# Patient Record
Sex: Male | Born: 1990 | Race: White | Hispanic: No | Marital: Married | Smoking: Former smoker
Health system: Southern US, Community
[De-identification: ages and names within clinical notes are randomized; demographics above are authoritative.]

## PROBLEM LIST (undated history)

## (undated) DIAGNOSIS — F4310 Post-traumatic stress disorder, unspecified: Secondary | ICD-10-CM

## (undated) DIAGNOSIS — J189 Pneumonia, unspecified organism: Secondary | ICD-10-CM

## (undated) DIAGNOSIS — G4730 Sleep apnea, unspecified: Secondary | ICD-10-CM

---

## 2013-02-26 ENCOUNTER — Encounter (HOSPITAL_COMMUNITY): Payer: Self-pay | Admitting: *Deleted

## 2013-02-26 ENCOUNTER — Emergency Department (HOSPITAL_COMMUNITY)
Admission: EM | Admit: 2013-02-26 | Discharge: 2013-02-26 | Disposition: A | Discharge status: Home / Self Care | Payer: No Typology Code available for payment source | Attending: Emergency Medicine | Admitting: Emergency Medicine

## 2013-02-26 ENCOUNTER — Emergency Department (HOSPITAL_COMMUNITY): Payer: No Typology Code available for payment source

## 2013-02-26 DIAGNOSIS — R059 Cough, unspecified: Secondary | ICD-10-CM | POA: Insufficient documentation

## 2013-02-26 DIAGNOSIS — R071 Chest pain on breathing: Secondary | ICD-10-CM | POA: Insufficient documentation

## 2013-02-26 DIAGNOSIS — Z880 Allergy status to penicillin: Secondary | ICD-10-CM | POA: Insufficient documentation

## 2013-02-26 DIAGNOSIS — R0982 Postnasal drip: Secondary | ICD-10-CM | POA: Insufficient documentation

## 2013-02-26 DIAGNOSIS — R0789 Other chest pain: Secondary | ICD-10-CM

## 2013-02-26 DIAGNOSIS — R0602 Shortness of breath: Secondary | ICD-10-CM | POA: Insufficient documentation

## 2013-02-26 DIAGNOSIS — R05 Cough: Secondary | ICD-10-CM | POA: Insufficient documentation

## 2013-02-26 HISTORY — DX: Sleep apnea, unspecified: G47.30

## 2013-02-26 LAB — CBC WITH DIFFERENTIAL/PLATELET
Basophils Relative: 1 % (ref 0–1)
Eosinophils Absolute: 0.2 10*3/uL (ref 0.0–0.7)
Eosinophils Relative: 3 % (ref 0–5)
HCT: 40.9 % (ref 39.0–52.0)
Hemoglobin: 14.6 g/dL (ref 13.0–17.0)
Lymphs Abs: 1.9 10*3/uL (ref 0.7–4.0)
MCH: 29.4 pg (ref 26.0–34.0)
MCHC: 35.7 g/dL (ref 30.0–36.0)
MCV: 82.5 fL (ref 78.0–100.0)
Monocytes Absolute: 0.7 10*3/uL (ref 0.1–1.0)
Monocytes Relative: 10 % (ref 3–12)
Neutro Abs: 3.8 10*3/uL (ref 1.7–7.7)
RBC: 4.96 MIL/uL (ref 4.22–5.81)
WBC: 6.6 10*3/uL (ref 4.0–10.5)

## 2013-02-26 LAB — BASIC METABOLIC PANEL
BUN: 13 mg/dL (ref 6–23)
CO2: 27 mEq/L (ref 19–32)
Chloride: 106 mEq/L (ref 96–112)
GFR calc Af Amer: >90 mL/min (ref >90)
GFR calc non Af Amer: >90 mL/min (ref >90)
Glucose, Bld: 101 mg/dL — ABNORMAL HIGH (ref 70–99)
Potassium: 3.9 mEq/L (ref 3.5–5.1)

## 2013-02-26 MED ORDER — HYDROCODONE-ACETAMINOPHEN 7.5-325 MG/15ML PO SOLN: 15.0000 mL | Freq: Three times a day (TID) | ORAL | Status: DC | PRN

## 2013-02-26 MED ORDER — HYDROCOD POLST-CHLORPHEN POLST 10-8 MG/5ML PO LQCR
5.0000 mL | Freq: Once | ORAL | Status: AC
  Administered 2013-02-26: 5 mL via ORAL
  Filled 2013-02-26: qty 5

## 2013-02-26 NOTE — ED Notes (Signed)
Pt c/o productive cough x 2 weeks with increasing sob on ambulation since this weekend.  Sternal chest pain on inspiration.  Denies recent travel.

## 2013-02-28 NOTE — ED Provider Notes (Signed)
CSN: [id number]     Arrival date & time 02/26/13  1835 History   First MD Initiated Contact with Patient 02/26/13 2010     Chief Complaint  Patient presents with  . Cough  . Shortness of Breath  . Chest Pain   (Consider location/radiation/quality/duration/timing/severity/associated sxs/prior Treatment) HPI  Jeffrey Hoffman is a 22 y.o. male complaining of productive cough, shortness of breath, pleuritic chest pain worsening over the course of the last 5 days. Patient denies history of DVT, PE, recent immobilizations, leg pain or swelling, fever, nausea vomiting, abdominal pain, change in bowel or bladder habits.  Past Medical History  Diagnosis Date  . Sleep apnea    History reviewed. No pertinent past surgical history. No family history on file. History  Substance Use Topics  . Smoking status: Never Smoker   . Smokeless tobacco: Current User    Types: Chew  . Alcohol Use: No    Review of Systems 10 systems reviewed and found to be negative, except as noted in the HPI   Allergies  Amoxicillin and Penicillins  Home Medications   Current Outpatient Rx  Name  Route  Sig  Dispense  Refill  . HYDROcodone-acetaminophen (HYCET) 7.5-325 mg/15 ml solution   Oral   Take 15 mLs by mouth every 8 (eight) hours as needed for pain.   75 mL   0    BP 123/76  Pulse 65  Temp(Src) 98.2 F (36.8 C) (Oral)  Resp 17  SpO2 99% Physical Exam  Nursing note and vitals reviewed. Constitutional: He is oriented to person, place, and time. He appears well-developed and well-nourished. No distress.  HENT:  Head: Normocephalic.  Nose: Rhinorrhea present.  Mouth/Throat: Oropharynx is clear and moist.  Posterior pharynx is injected  Eyes: Conjunctivae and EOM are normal. Pupils are equal, round, and reactive to light.  Neck: Normal range of motion.  Cardiovascular: Normal rate, regular rhythm and intact distal pulses.   Pulmonary/Chest: Effort normal and breath sounds normal. No stridor.  No respiratory distress. He has no wheezes. He has no rales. He exhibits no tenderness.  Abdominal: Soft. Bowel sounds are normal. He exhibits no distension and no mass. There is no tenderness. There is no rebound and no guarding.  Musculoskeletal: Normal range of motion. He exhibits no edema.  No calf asymmetry, superficial collaterals, palpable cords, edema, Homans sign negative bilaterally.    Neurological: He is alert and oriented to person, place, and time.  Psychiatric: He has a normal mood and affect.    ED Course  Procedures (including critical care time) Labs Review Labs Reviewed  BASIC METABOLIC PANEL - Abnormal; Notable for the following:    Glucose, Bld 101 (*)    All other components within normal limits  CBC WITH DIFFERENTIAL  D-DIMER, QUANTITATIVE  POCT I-STAT TROPONIN I   Imaging Review Dg Chest 2 View (if Patient Has Fever And/or Copd)  02/26/2013   *RADIOLOGY REPORT*  Clinical Data: Cough, shortness of breath, chest pain  CHEST - 2 VIEW  Comparison: None.  Findings: The heart size and vascular pattern are normal.  The lungs are clear.  There are no pleural effusions.  IMPRESSION: Negative   Original Report Authenticated By: Esperanza Heir, M.D.    MDM   1. Cough   2. Postnasal drip   3. Chest wall pain    Filed Vitals:   02/26/13 1851 02/26/13 2036 02/26/13 2150  BP: 121/87 122/79 123/76  Pulse: 105  65  Temp: 97.4 F (36.3  C) 98 F (36.7 C) 98.2 F (36.8 C)  TempSrc: Oral Oral Oral  Resp: 16 17 17   SpO2: 96% 95% 99%     Jeffrey Hoffman is a 22 y.o. male  productive cough, pleuritic chest pain and shortness of breath. Chest x-ray is negative, lung sounds are clear to auscultation. Patient is mildly tachycardic and therefore does not meet PERC criteria. D-dimer is negative and I think this is likely secondary to a postnasal drip.  Medications  chlorpheniramine-HYDROcodone (TUSSIONEX) 10-8 MG/5ML suspension 5 mL (5 mLs Oral Given 02/26/13 2037)    Pt  is hemodynamically stable, appropriate for, and amenable to discharge at this time. Pt verbalized understanding and agrees with care plan. All questions answered. Outpatient follow-up and specific return precautions discussed.    Discharge Medication List as of 02/26/2013  9:43 PM    START taking these medications   Details  HYDROcodone-acetaminophen (HYCET) 7.5-325 mg/15 ml solution Take 15 mLs by mouth every 8 (eight) hours as needed for pain., Starting 02/26/2013, Until Discontinued, Print        Note: Portions of this report may have been transcribed using voice recognition software. Every effort was made to ensure accuracy; however, inadvertent computerized transcription errors may be present     Wynetta Emery, PA-C 02/28/13 1637

## 2013-03-02 NOTE — ED Provider Notes (Signed)
Medical screening examination/treatment/procedure(s) were performed by non-physician practitioner and as supervising physician I was immediately available for consultation/collaboration.   Candyce Churn, MD 03/02/13 [phone]

## 2013-12-25 ENCOUNTER — Other Ambulatory Visit: Payer: Self-pay | Admitting: Gastroenterology

## 2013-12-25 DIAGNOSIS — R109 Unspecified abdominal pain: Secondary | ICD-10-CM

## 2014-01-06 ENCOUNTER — Other Ambulatory Visit: Payer: No Typology Code available for payment source

## 2014-06-11 ENCOUNTER — Observation Stay (HOSPITAL_COMMUNITY): Payer: BLUE CROSS/BLUE SHIELD | Admitting: Anesthesiology

## 2014-06-11 ENCOUNTER — Emergency Department (HOSPITAL_COMMUNITY): Payer: BLUE CROSS/BLUE SHIELD

## 2014-06-11 ENCOUNTER — Observation Stay (HOSPITAL_COMMUNITY)
Admission: EM | Admit: 2014-06-11 | Discharge: 2014-06-12 | Disposition: A | Discharge status: Home / Self Care | Payer: BLUE CROSS/BLUE SHIELD | Attending: General Surgery | Admitting: General Surgery

## 2014-06-11 ENCOUNTER — Encounter (HOSPITAL_COMMUNITY): Payer: Self-pay | Admitting: Emergency Medicine

## 2014-06-11 ENCOUNTER — Encounter (HOSPITAL_COMMUNITY)
Admission: EM | Disposition: A | Discharge status: Home / Self Care | Payer: Self-pay | Source: Home / Self Care | Attending: Emergency Medicine

## 2014-06-11 DIAGNOSIS — K37 Unspecified appendicitis: Secondary | ICD-10-CM | POA: Diagnosis present

## 2014-06-11 DIAGNOSIS — R1031 Right lower quadrant pain: Secondary | ICD-10-CM | POA: Diagnosis present

## 2014-06-11 DIAGNOSIS — G4730 Sleep apnea, unspecified: Secondary | ICD-10-CM | POA: Insufficient documentation

## 2014-06-11 DIAGNOSIS — F4310 Post-traumatic stress disorder, unspecified: Secondary | ICD-10-CM | POA: Diagnosis not present

## 2014-06-11 DIAGNOSIS — K3580 Unspecified acute appendicitis: Secondary | ICD-10-CM | POA: Diagnosis not present

## 2014-06-11 HISTORY — DX: Pneumonia, unspecified organism: J18.9

## 2014-06-11 HISTORY — PX: LAPAROSCOPIC APPENDECTOMY: SHX408

## 2014-06-11 HISTORY — PX: APPENDECTOMY: SHX54

## 2014-06-11 HISTORY — DX: Post-traumatic stress disorder, unspecified: F43.10

## 2014-06-11 LAB — CBC WITH DIFFERENTIAL/PLATELET
BASOS ABS: 0.1 10*3/uL (ref 0.0–0.1)
Basophils Relative: 0 % (ref 0–1)
Eosinophils Absolute: 0.1 10*3/uL (ref 0.0–0.7)
Eosinophils Relative: 1 % (ref 0–5)
HEMATOCRIT: 41.6 % (ref 39.0–52.0)
HEMOGLOBIN: 15.1 g/dL (ref 13.0–17.0)
LYMPHS PCT: 12 % (ref 12–46)
Lymphs Abs: 2.1 10*3/uL (ref 0.7–4.0)
MCH: 29.7 pg (ref 26.0–34.0)
MCHC: 36.3 g/dL — AB (ref 30.0–36.0)
MCV: 81.9 fL (ref 78.0–100.0)
MONO ABS: 1.2 10*3/uL — AB (ref 0.1–1.0)
Monocytes Relative: 7 % (ref 3–12)
NEUTROS ABS: 13.5 10*3/uL — AB (ref 1.7–7.7)
Neutrophils Relative %: 80 % — ABNORMAL HIGH (ref 43–77)
PLATELETS: 334 10*3/uL (ref 150–400)
RBC: 5.08 MIL/uL (ref 4.22–5.81)
RDW: 12.4 % (ref 11.5–15.5)
WBC: 18 10*3/uL — AB (ref 4.0–10.5)

## 2014-06-11 LAB — COMPREHENSIVE METABOLIC PANEL
ALT: 23 U/L (ref 0–53)
AST: 22 U/L (ref 0–37)
Albumin: 4.6 g/dL (ref 3.5–5.2)
Alkaline Phosphatase: 74 U/L (ref 39–117)
Anion gap: 9 (ref 5–15)
BILIRUBIN TOTAL: 0.6 mg/dL (ref 0.3–1.2)
BUN: 12 mg/dL (ref 6–23)
CO2: 25 mmol/L (ref 19–32)
Calcium: 10 mg/dL (ref 8.4–10.5)
Chloride: 104 mEq/L (ref 96–112)
Creatinine, Ser: 0.99 mg/dL (ref 0.50–1.35)
GFR calc Af Amer: >90 mL/min (ref >90)
GFR calc non Af Amer: >90 mL/min (ref >90)
GLUCOSE: 109 mg/dL — AB (ref 70–99)
POTASSIUM: 4.2 mmol/L (ref 3.5–5.1)
Sodium: 138 mmol/L (ref 135–145)
Total Protein: 7.7 g/dL (ref 6.0–8.3)

## 2014-06-11 LAB — URINALYSIS, ROUTINE W REFLEX MICROSCOPIC
Bilirubin Urine: NEGATIVE
Glucose, UA: NEGATIVE mg/dL
HGB URINE DIPSTICK: NEGATIVE
KETONES UR: 15 mg/dL — AB
Leukocytes, UA: NEGATIVE
Nitrite: NEGATIVE
PH: 6 (ref 5.0–8.0)
Protein, ur: NEGATIVE mg/dL
Specific Gravity, Urine: 1.033 — ABNORMAL HIGH (ref 1.005–1.030)
Urobilinogen, UA: 0.2 mg/dL (ref 0.0–1.0)

## 2014-06-11 LAB — LIPASE, BLOOD: Lipase: 23 U/L (ref 11–59)

## 2014-06-11 SURGERY — APPENDECTOMY, LAPAROSCOPIC
Anesthesia: General | Site: Abdomen

## 2014-06-11 MED ORDER — ONDANSETRON HCL 4 MG/2ML IJ SOLN
4.0000 mg | Freq: Once | INTRAMUSCULAR | Status: AC
  Administered 2014-06-11: 4 mg via INTRAVENOUS
  Filled 2014-06-11: qty 2

## 2014-06-11 MED ORDER — DIPHENHYDRAMINE HCL 12.5 MG/5ML PO ELIX: 12.5000 mg | ORAL_SOLUTION | Freq: Four times a day (QID) | ORAL | Status: DC | PRN

## 2014-06-11 MED ORDER — SODIUM CHLORIDE 0.9 % IV SOLN
1000.0000 mL | Freq: Once | INTRAVENOUS | Status: AC
  Administered 2014-06-11: 1000 mL via INTRAVENOUS

## 2014-06-11 MED ORDER — LACTATED RINGERS IV SOLN
INTRAVENOUS | Status: DC | PRN
  Administered 2014-06-11 (×2): via INTRAVENOUS

## 2014-06-11 MED ORDER — IOHEXOL 300 MG/ML  SOLN
25.0000 mL | Freq: Once | INTRAMUSCULAR | Status: AC | PRN
  Administered 2014-06-11: 25 mL via ORAL

## 2014-06-11 MED ORDER — CIPROFLOXACIN IN D5W 400 MG/200ML IV SOLN
400.0000 mg | Freq: Once | INTRAVENOUS | Status: AC
  Administered 2014-06-11: 400 mg via INTRAVENOUS
  Filled 2014-06-11: qty 200

## 2014-06-11 MED ORDER — HYDROCODONE-ACETAMINOPHEN 5-325 MG PO TABS
1.0000 | ORAL_TABLET | Freq: Four times a day (QID) | ORAL | Status: DC | PRN
  Administered 2014-06-11 – 2014-06-12 (×2): 2 via ORAL
  Filled 2014-06-11 (×5): qty 2

## 2014-06-11 MED ORDER — LIDOCAINE HCL (CARDIAC) 20 MG/ML IV SOLN
INTRAVENOUS | Status: DC | PRN
  Administered 2014-06-11: 50 mg via INTRAVENOUS

## 2014-06-11 MED ORDER — FENTANYL CITRATE 0.05 MG/ML IJ SOLN
INTRAMUSCULAR | Status: DC | PRN
  Administered 2014-06-11 (×2): 50 ug via INTRAVENOUS
  Administered 2014-06-11: 100 ug via INTRAVENOUS

## 2014-06-11 MED ORDER — METRONIDAZOLE IN NACL 5-0.79 MG/ML-% IV SOLN
500.0000 mg | Freq: Once | INTRAVENOUS | Status: AC
  Administered 2014-06-11: 500 mg via INTRAVENOUS
  Filled 2014-06-11: qty 100

## 2014-06-11 MED ORDER — CIPROFLOXACIN IN D5W 400 MG/200ML IV SOLN
400.0000 mg | Freq: Two times a day (BID) | INTRAVENOUS | Status: DC
  Filled 2014-06-11: qty 200

## 2014-06-11 MED ORDER — ONDANSETRON HCL 4 MG/2ML IJ SOLN
INTRAMUSCULAR | Status: DC | PRN
  Administered 2014-06-11: 4 mg via INTRAVENOUS

## 2014-06-11 MED ORDER — ONDANSETRON HCL 4 MG/2ML IJ SOLN
INTRAMUSCULAR | Status: AC
  Filled 2014-06-11: qty 2

## 2014-06-11 MED ORDER — DIPHENHYDRAMINE HCL 50 MG/ML IJ SOLN
12.5000 mg | Freq: Four times a day (QID) | INTRAMUSCULAR | Status: DC | PRN
  Administered 2014-06-11 – 2014-06-12 (×2): 25 mg via INTRAVENOUS
  Filled 2014-06-11 (×2): qty 1

## 2014-06-11 MED ORDER — METRONIDAZOLE IN NACL 5-0.79 MG/ML-% IV SOLN
500.0000 mg | Freq: Three times a day (TID) | INTRAVENOUS | Status: AC
  Administered 2014-06-11 – 2014-06-12 (×2): 500 mg via INTRAVENOUS
  Filled 2014-06-11 (×2): qty 100

## 2014-06-11 MED ORDER — OXYCODONE HCL 5 MG PO TABS
ORAL_TABLET | ORAL | Status: AC
  Filled 2014-06-11: qty 1

## 2014-06-11 MED ORDER — HYDROMORPHONE HCL 1 MG/ML IJ SOLN
1.0000 mg | Freq: Once | INTRAMUSCULAR | Status: AC
  Administered 2014-06-11: 1 mg via INTRAVENOUS
  Filled 2014-06-11: qty 1

## 2014-06-11 MED ORDER — ONDANSETRON HCL 4 MG/2ML IJ SOLN: 4.0000 mg | Freq: Once | INTRAMUSCULAR | Status: DC

## 2014-06-11 MED ORDER — GLYCOPYRROLATE 0.2 MG/ML IJ SOLN
INTRAMUSCULAR | Status: DC | PRN
  Administered 2014-06-11: 0.4 mg via INTRAVENOUS
  Administered 2014-06-11: 0.2 mg via INTRAVENOUS

## 2014-06-11 MED ORDER — IOHEXOL 300 MG/ML  SOLN
100.0000 mL | Freq: Once | INTRAMUSCULAR | Status: AC | PRN
  Administered 2014-06-11: 100 mL via INTRAVENOUS

## 2014-06-11 MED ORDER — BUPIVACAINE-EPINEPHRINE 0.25% -1:200000 IJ SOLN
INTRAMUSCULAR | Status: DC | PRN
  Administered 2014-06-11: 30 mL

## 2014-06-11 MED ORDER — SUCCINYLCHOLINE CHLORIDE 20 MG/ML IJ SOLN
INTRAMUSCULAR | Status: DC | PRN
  Administered 2014-06-11: 100 mg via INTRAVENOUS

## 2014-06-11 MED ORDER — FENTANYL CITRATE 0.05 MG/ML IJ SOLN
INTRAMUSCULAR | Status: AC
  Filled 2014-06-11: qty 5

## 2014-06-11 MED ORDER — ONDANSETRON HCL 4 MG/2ML IJ SOLN
4.0000 mg | Freq: Four times a day (QID) | INTRAMUSCULAR | Status: DC | PRN
  Administered 2014-06-11: 4 mg via INTRAVENOUS
  Filled 2014-06-11: qty 2

## 2014-06-11 MED ORDER — NEOSTIGMINE METHYLSULFATE 10 MG/10ML IV SOLN
INTRAVENOUS | Status: DC | PRN
  Administered 2014-06-11: 3 mg via INTRAVENOUS

## 2014-06-11 MED ORDER — HYDROMORPHONE HCL 1 MG/ML IJ SOLN
INTRAMUSCULAR | Status: AC
  Administered 2014-06-11: 0.5 mg via INTRAVENOUS
  Filled 2014-06-11: qty 1

## 2014-06-11 MED ORDER — ROCURONIUM BROMIDE 100 MG/10ML IV SOLN
INTRAVENOUS | Status: DC | PRN
  Administered 2014-06-11: 30 mg via INTRAVENOUS
  Administered 2014-06-11: 20 mg via INTRAVENOUS

## 2014-06-11 MED ORDER — METRONIDAZOLE IN NACL 5-0.79 MG/ML-% IV SOLN
500.0000 mg | Freq: Three times a day (TID) | INTRAVENOUS | Status: DC
  Filled 2014-06-11 (×2): qty 100

## 2014-06-11 MED ORDER — DEXAMETHASONE SODIUM PHOSPHATE 4 MG/ML IJ SOLN
INTRAMUSCULAR | Status: DC | PRN
  Administered 2014-06-11: 8 mg via INTRAVENOUS

## 2014-06-11 MED ORDER — MIDAZOLAM HCL 5 MG/5ML IJ SOLN
INTRAMUSCULAR | Status: DC | PRN
  Administered 2014-06-11: 2 mg via INTRAVENOUS

## 2014-06-11 MED ORDER — SODIUM CHLORIDE 0.9 % IV SOLN
1000.0000 mL | INTRAVENOUS | Status: DC
  Administered 2014-06-11: 1000 mL via INTRAVENOUS

## 2014-06-11 MED ORDER — MIDAZOLAM HCL 2 MG/2ML IJ SOLN
INTRAMUSCULAR | Status: AC
  Filled 2014-06-11: qty 2

## 2014-06-11 MED ORDER — POTASSIUM CHLORIDE IN NACL 20-0.9 MEQ/L-% IV SOLN
INTRAVENOUS | Status: DC
  Administered 2014-06-11 – 2014-06-12 (×2): via INTRAVENOUS
  Filled 2014-06-11 (×4): qty 1000

## 2014-06-11 MED ORDER — ONDANSETRON HCL 4 MG/2ML IJ SOLN
4.0000 mg | Freq: Once | INTRAMUSCULAR | Status: AC | PRN
  Administered 2014-06-11: 4 mg via INTRAVENOUS

## 2014-06-11 MED ORDER — LACTATED RINGERS IV SOLN
INTRAVENOUS | Status: DC
  Administered 2014-06-11: 11:00:00 via INTRAVENOUS

## 2014-06-11 MED ORDER — HYDROMORPHONE HCL 1 MG/ML IJ SOLN
0.2500 mg | INTRAMUSCULAR | Status: DC | PRN
  Administered 2014-06-11: 0.25 mg via INTRAVENOUS
  Administered 2014-06-11 (×2): 0.5 mg via INTRAVENOUS
  Administered 2014-06-11: 0.25 mg via INTRAVENOUS
  Administered 2014-06-11: 0.5 mg via INTRAVENOUS

## 2014-06-11 MED ORDER — PROPOFOL 10 MG/ML IV BOLUS
INTRAVENOUS | Status: AC
  Filled 2014-06-11: qty 20

## 2014-06-11 MED ORDER — SODIUM CHLORIDE 0.9 % IR SOLN
Status: DC | PRN
  Administered 2014-06-11: 1000 mL

## 2014-06-11 MED ORDER — PRAZOSIN HCL 5 MG PO CAPS
5.0000 mg | ORAL_CAPSULE | Freq: Every evening | ORAL | Status: DC | PRN
  Filled 2014-06-11: qty 1

## 2014-06-11 MED ORDER — OXYCODONE HCL 5 MG PO TABS
5.0000 mg | ORAL_TABLET | Freq: Once | ORAL | Status: AC | PRN
  Administered 2014-06-11: 5 mg via ORAL

## 2014-06-11 MED ORDER — OXYCODONE HCL 5 MG/5ML PO SOLN: 5.0000 mg | Freq: Once | ORAL | Status: AC | PRN

## 2014-06-11 MED ORDER — BUPIVACAINE-EPINEPHRINE (PF) 0.25% -1:200000 IJ SOLN
INTRAMUSCULAR | Status: AC
  Filled 2014-06-11: qty 30

## 2014-06-11 MED ORDER — PROPOFOL 10 MG/ML IV BOLUS
INTRAVENOUS | Status: DC | PRN
  Administered 2014-06-11: 200 mg via INTRAVENOUS

## 2014-06-11 MED ORDER — HYDROMORPHONE HCL 1 MG/ML IJ SOLN
0.5000 mg | INTRAMUSCULAR | Status: DC | PRN
  Administered 2014-06-11 – 2014-06-12 (×7): 1 mg via INTRAVENOUS
  Filled 2014-06-11 (×9): qty 1

## 2014-06-11 MED ORDER — CIPROFLOXACIN IN D5W 400 MG/200ML IV SOLN
400.0000 mg | Freq: Two times a day (BID) | INTRAVENOUS | Status: AC
  Administered 2014-06-11 – 2014-06-12 (×2): 400 mg via INTRAVENOUS
  Filled 2014-06-11 (×2): qty 200

## 2014-06-11 MED ORDER — DOCUSATE SODIUM 100 MG PO CAPS: 100.0000 mg | ORAL_CAPSULE | Freq: Two times a day (BID) | ORAL | Status: DC | PRN

## 2014-06-11 MED ORDER — HYDROMORPHONE HCL 1 MG/ML IJ SOLN
INTRAMUSCULAR | Status: AC
  Administered 2014-06-11: 0.25 mg via INTRAVENOUS
  Filled 2014-06-11: qty 1

## 2014-06-11 MED ORDER — 0.9 % SODIUM CHLORIDE (POUR BTL) OPTIME
TOPICAL | Status: DC | PRN
  Administered 2014-06-11: 1000 mL

## 2014-06-11 SURGICAL SUPPLY — 46 items
APPLIER CLIP ROT 10 11.4 M/L (STAPLE)
CANISTER SUCTION 2500CC (MISCELLANEOUS) ×3 IMPLANT
CHLORAPREP W/TINT 26ML (MISCELLANEOUS) ×3 IMPLANT
CLIP APPLIE ROT 10 11.4 M/L (STAPLE) IMPLANT
COVER SURGICAL LIGHT HANDLE (MISCELLANEOUS) ×3 IMPLANT
CUTTER FLEX LINEAR 45M (STAPLE) ×3 IMPLANT
DEVICE TROCAR PUNCTURE CLOSURE (ENDOMECHANICALS) ×3 IMPLANT
DRAPE LAPAROSCOPIC ABDOMINAL (DRAPES) ×3 IMPLANT
ELECT REM PT RETURN 9FT ADLT (ELECTROSURGICAL) ×3
ELECTRODE REM PT RTRN 9FT ADLT (ELECTROSURGICAL) ×1 IMPLANT
GLOVE BIO SURGEON STRL SZ 6 (GLOVE) ×3 IMPLANT
GLOVE BIO SURGEON STRL SZ 6.5 (GLOVE) ×2 IMPLANT
GLOVE BIO SURGEON STRL SZ7 (GLOVE) ×6 IMPLANT
GLOVE BIO SURGEONS STRL SZ 6.5 (GLOVE) ×1
GLOVE BIOGEL PI IND STRL 6.5 (GLOVE) ×1 IMPLANT
GLOVE BIOGEL PI IND STRL 7.0 (GLOVE) ×2 IMPLANT
GLOVE BIOGEL PI IND STRL 7.5 (GLOVE) ×3 IMPLANT
GLOVE BIOGEL PI INDICATOR 6.5 (GLOVE) ×2
GLOVE BIOGEL PI INDICATOR 7.0 (GLOVE) ×4
GLOVE BIOGEL PI INDICATOR 7.5 (GLOVE) ×6
GLOVE SURG SS PI 7.5 STRL IVOR (GLOVE) ×3 IMPLANT
GOWN STRL REUS W/ TWL LRG LVL3 (GOWN DISPOSABLE) ×3 IMPLANT
GOWN STRL REUS W/TWL LRG LVL3 (GOWN DISPOSABLE) ×6
KIT BASIN OR (CUSTOM PROCEDURE TRAY) ×3 IMPLANT
KIT ROOM TURNOVER OR (KITS) ×3 IMPLANT
LIQUID BAND (GAUZE/BANDAGES/DRESSINGS) ×3 IMPLANT
NS IRRIG 1000ML POUR BTL (IV SOLUTION) ×3 IMPLANT
PAD ARMBOARD 7.5X6 YLW CONV (MISCELLANEOUS) ×6 IMPLANT
POUCH RETRIEVAL ECOSAC 10 (ENDOMECHANICALS) ×1 IMPLANT
POUCH RETRIEVAL ECOSAC 10MM (ENDOMECHANICALS) ×2
RELOAD 45 VASCULAR/THIN (ENDOMECHANICALS) ×3 IMPLANT
RELOAD STAPLE TA45 3.5 REG BLU (ENDOMECHANICALS) ×3 IMPLANT
SCALPEL HARMONIC ACE (MISCELLANEOUS) ×3 IMPLANT
SCISSORS LAP 5X35 DISP (ENDOMECHANICALS) IMPLANT
SET IRRIG TUBING LAPAROSCOPIC (IRRIGATION / IRRIGATOR) ×3 IMPLANT
SLEEVE ENDOPATH XCEL 5M (ENDOMECHANICALS) ×3 IMPLANT
SPECIMEN JAR SMALL (MISCELLANEOUS) ×3 IMPLANT
SUT MNCRL AB 4-0 PS2 18 (SUTURE) ×3 IMPLANT
SUT VICRYL 0 UR6 27IN ABS (SUTURE) ×3 IMPLANT
TOWEL OR 17X24 6PK STRL BLUE (TOWEL DISPOSABLE) ×3 IMPLANT
TOWEL OR 17X26 10 PK STRL BLUE (TOWEL DISPOSABLE) ×3 IMPLANT
TRAY FOLEY CATH 16FR SILVER (SET/KITS/TRAYS/PACK) ×3 IMPLANT
TRAY LAPAROSCOPIC (CUSTOM PROCEDURE TRAY) ×3 IMPLANT
TROCAR XCEL BLUNT TIP 100MML (ENDOMECHANICALS) ×3 IMPLANT
TROCAR XCEL NON-BLD 5MMX100MML (ENDOMECHANICALS) ×3 IMPLANT
TUBING INSUFFLATION (TUBING) ×3 IMPLANT

## 2014-06-11 NOTE — Anesthesia Preprocedure Evaluation (Signed)
Anesthesia Evaluation  Patient identified by MRN, date of birth, ID band  Reviewed: Allergy & Precautions, NPO status , Patient's Chart, lab work & pertinent test results  Airway Mallampati: II  TM Distance: >3 FB Neck ROM: Full    Dental  (+) Teeth Intact, Dental Advisory Given   Pulmonary  breath sounds clear to auscultation        Cardiovascular Rhythm:Regular Rate:Normal     Neuro/Psych    GI/Hepatic   Endo/Other    Renal/GU      Musculoskeletal   Abdominal   Peds  Hematology   Anesthesia Other Findings   Reproductive/Obstetrics                             Anesthesia Physical Anesthesia Plan  ASA: II and emergent  Anesthesia Plan: General   Post-op Pain Management:    Induction: Rapid sequence and Intravenous  Airway Management Planned: Oral ETT  Additional Equipment:   Intra-op Plan:   Post-operative Plan: Extubation in OR  Informed Consent: I have reviewed the patients History and Physical, chart, labs and discussed the procedure including the risks, benefits and alternatives for the proposed anesthesia with the patient or authorized representative who has indicated his/her understanding and acceptance.   Dental advisory given  Plan Discussed with: CRNA and Anesthesiologist  Anesthesia Plan Comments: (Acute appendicitis  Plan GA with RSI  Kipp Broodavid Hayleigh Bawa)        Anesthesia Quick Evaluation

## 2014-06-11 NOTE — ED Notes (Signed)
CT has been called that pt finished his contrast

## 2014-06-11 NOTE — ED Provider Notes (Signed)
CSN: [id number]     Arrival date & time 06/11/14  [id number] History   First MD Initiated Contact with Patient 06/11/14 [phone]     Chief Complaint  Patient presents with  . Abdominal Pain    The patient has been having abdominal pain, nausea and vomiting.  He says he left work due to having abdominal pain.     (Consider location/radiation/quality/duration/timing/severity/associated sxs/prior Treatment) Patient is a 24 y.o. male presenting with abdominal pain. The history is provided by the patient. No language interpreter was used.  Abdominal Pain Pain location:  RLQ Pain quality: aching   Pain severity:  Moderate Onset quality:  Gradual Duration:  2 days Timing:  Constant Progression:  Worsening Chronicity:  New Context: not alcohol use, not eating and not trauma   Relieved by:  Nothing Worsened by:  Nothing tried Ineffective treatments:  None tried Risk factors: has not had multiple surgeries     Past Medical History  Diagnosis Date  . Sleep apnea    History reviewed. No pertinent past surgical history. History reviewed. No pertinent family history. History  Substance Use Topics  . Smoking status: Never Smoker   . Smokeless tobacco: Current User    Types: Chew  . Alcohol Use: No    Review of Systems  Gastrointestinal: Positive for abdominal pain.  All other systems reviewed and are negative.     Allergies  Amoxicillin and Penicillins  Home Medications   Prior to Admission medications   Medication Sig Start Date End Date Taking? Authorizing Provider  HYDROcodone-acetaminophen (HYCET) 7.5-325 mg/15 ml solution Take 15 mLs by mouth every 8 (eight) hours as needed for pain. 02/26/13   Nicole Pisciotta, PA-C   BP 121/78 mmHg  Pulse 79  Temp(Src) 98.5 F (36.9 C) (Oral)  Resp 20  Ht 6\' 1"  (1.854 m)  Wt 232 lb (105.235 kg)  BMI 30.62 kg/m2  SpO2 96% Physical Exam  Constitutional: He is oriented to person, place, and time. He appears well-developed and  well-nourished.  HENT:  Head: Normocephalic.  Right Ear: External ear normal.  Left Ear: External ear normal.  Mouth/Throat: Oropharynx is clear and moist.  Eyes: EOM are normal. Pupils are equal, round, and reactive to light.  Neck: Normal range of motion.  Cardiovascular: Normal rate and normal heart sounds.   Pulmonary/Chest: Effort normal and breath sounds normal.  Abdominal: Soft. He exhibits no distension. There is tenderness.  Musculoskeletal: Normal range of motion.  Neurological: He is alert and oriented to person, place, and time.  Skin: Skin is warm.  Psychiatric: He has a normal mood and affect.  Nursing note and vitals reviewed.   ED Course  Procedures (including critical care time) Labs Review Labs Reviewed  CBC WITH DIFFERENTIAL - Abnormal; Notable for the following:    WBC 18.0 (*)    MCHC 36.3 (*)    Neutrophils Relative % 80 (*)    Neutro Abs 13.5 (*)    Monocytes Absolute 1.2 (*)    All other components within normal limits  COMPREHENSIVE METABOLIC PANEL - Abnormal; Notable for the following:    Glucose, Bld 109 (*)    All other components within normal limits  LIPASE, BLOOD  URINALYSIS, ROUTINE W REFLEX MICROSCOPIC  I-STAT TROPOININ, ED    Imaging Review Ct Abdomen Pelvis W Contrast  06/11/2014   CLINICAL DATA:  Initial encounter for severe right lower quadrant pain with nausea and vomiting for 1 day duration.  EXAM: CT ABDOMEN AND PELVIS WITH CONTRAST  TECHNIQUE: Multidetector CT imaging of the abdomen and pelvis was performed using the standard protocol following bolus administration of intravenous contrast.  CONTRAST:  OMNIPAQUE IOHEXOL 300 MG/ML  SOLN  COMPARISON:  None.  FINDINGS: Lower chest:  Unremarkable.  Hepatobiliary: No focal abnormality within the liver parenchyma. There is no evidence for gallstones, gallbladder wall thickening, or pericholecystic fluid. No intrahepatic or extrahepatic biliary dilation.  Pancreas: No focal mass lesion. No  dilatation of the main duct. No intraparenchymal cyst. No peripancreatic edema.  Spleen: No splenomegaly. No focal mass lesion.  Adrenals/Urinary Tract: No adrenal nodule or mass. Kidneys are unremarkable. Specifically, no evidence for renal stones. No hydroureter or ureteral stones. Urinary bladder is decompressed with normal imaging features.  Stomach/Bowel: Stomach is nondistended. No gastric wall thickening. No evidence of outlet obstruction. Duodenum is normally positioned as is the ligament of Treitz. No small bowel wall thickening. No small bowel dilatation. Terminal ileum is normal. The appendix is dilated at 9 mm diameter with a diffusely fluid filled lumen and subtle periappendiceal edema/ inflammation. No extraluminal gas or adjacent mesenteric abscess. No gross colonic mass. No colonic wall thickening. No substantial diverticular change.  Multiple perirectal lymph nodes are identified, measuring up to 11 mm in short axis. There is some subtle wall thickening in the distal rectum, eccentric to the right.  Vascular/Lymphatic: No abdominal aortic aneurysm. No gastrohepatic or hepatoduodenal ligament lymphadenopathy. No retroperitoneal lymphadenopathy. There is no pelvic sidewall lymphadenopathy. Please see stomach/ bowel section above.  Reproductive: Prostate gland and seminal vesicles are unremarkable.  Other: No substantial intraperitoneal free fluid.  Musculoskeletal: Bone windows reveal no worrisome lytic or sclerotic osseous lesions.  IMPRESSION: Dilated inflamed appendix consistent with acute appendicitis. No evidence for perforation or abscess at this time.  Abnormal perirectal lymph nodes with subtle asymmetric wall thickening in the distal rectum. Although neoplasm would be unusual in a patient of this age, close followup is recommended and GI consultation may be indicated.  These results were called by telephone at the time of interpretation on 06/11/2014 at 7:35 am to Dr. Leanora Ivanoff, who verbally  acknowledged these results.   Electronically Signed   By: Kennith Center M.D.   On: 06/11/2014 07:39     EKG Interpretation None      MDM  Pt has elevated wbc of 18.  Ct scan shows appendicits.  Radiologist also reports rectal thickening and lymphadenopathy.   I spoke to Pa for [facility] [place] [facility] who will see to admit.  Pt advised of need to have gi follow up   Final diagnoses:  RLQ abdominal pain  Acute appendicitis, unspecified acute appendicitis type        Elson Areas, PA-C 06/11/14 8119  Ward Givens, MD 06/14/14 2308

## 2014-06-11 NOTE — Op Note (Signed)
Preoperative diagnosis: acute appendicitis Postoperative diagnosis: acute suppurative appendicitis Procedure: laparoscopic appendectomy Surgeon: Dr Harden MoMatt Hoffman Emma EBL: minimal Anesthesia: general Specimens: appendix to pathology Drains: none Complications: none Sponge and needle count correct  Disposition to recovery stable  Indications: This is a [age] yom who presents with rlq pain, elevated wbc and ct scan consistent with acute appendicitis. We discussed laparoscopic appendectomy.  Procedure: After informed consent was obtained the patient was taken to the operating room. ciprofloxacin and flagyl were given in the emergency room. SCDs were in place. He was placed under general anesthesia without complication. A foley catheter was placed. He was prepped and draped in the standard sterile surgical fashion.A surgical timeout was performed.  I infiltrated marcaine below the umbilicus.I made a vertical incision below the incision. I incised the fascia and entered the peritoneum. I placed a 0 vicryl pursestring suture through the fascia. I inserted a hasson trocar. I insufflated the abdomen to 15 mm Hg pressure. I then inserted two further 5 mm trocars in the suprapubic region and the llq. I then identified the appendix. I was able to encircle the base and divided this with the gia stapler. I then divided the mesentery with the harmonic scalpel. I then placed the appendix into a bag and removed it. Hemostasis was observed. I then removed the hasson trocar. I then used the endoclose device and closed the umbilical defect and tied down the pursestring. I then removed the trocars. Incisions were closed with 4-0 monocryl and glue. He tolerated this well, was extubated and transferred to recovery stable.

## 2014-06-11 NOTE — ED Notes (Signed)
Pt reported that it burned when he urinated which he had not experienced before.

## 2014-06-11 NOTE — H&P (Signed)
[facility] Admission Note  Jeffrey Hoffman 09/27/1990  [id number].    Requesting MD: Dr. 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  Pain has progressively worsened over the last 12 hours and peaked at a 10/10 sharp pain.  The pain originally started periumbilical and has settled in the RLQ.  Pain accompanied by N/V, and fever/chills.  Denies precipitating or alleviating factors, no radiating pain.  Notes back pain and some SOB due to pain with deep breaths.  Never had this before.  Does note he saw a GI doctor for excessive mucous found on stools with BM's, he says they didn't find any thing wrong.   Denies rectal bleeding, says he was constipated last night but usually has 3-4 BM's a day.  His CT shows acute appendicitis and leukocytosis.  He also has abnormal perirectal LAD and rectal wall thickening.  No h/o abdominal surgery.  ROS: All systems reviewed and otherwise negative except for as above  History reviewed. No pertinent family history.  Past Medical History  Diagnosis Date  . Sleep apnea     History reviewed. No pertinent past surgical history.  Social History:  reports that he has never smoked. His smokeless tobacco use includes Chew. He reports that he does not drink alcohol or use illicit drugs.  Allergies:  Allergies  Allergen Reactions  . Amoxicillin Shortness Of Breath and Palpitations  . Penicillins Shortness Of Breath and Palpitations     (Not in a hospital admission)  Blood pressure 124/58, pulse 56, temperature 98.5 F (36.9 C), temperature source Oral, resp. rate 18, height _0  (1.854 m), weight 232 lb (105.235 kg), SpO2 94 %. Physical Exam: General: pleasant, WD/WN white male who is laying in bed in NAD HEENT: head is normocephalic, atraumatic.  Sclera  are noninjected.  PERRL.  Ears and nose without any masses or lesions.  Mouth is pink and moist Heart: regular, rate, and rhythm.  No obvious murmurs, gallops, or rubs noted.  Palpable pedal pulses bilaterally Lungs: CTAB, no wheezes, rhonchi, or rales noted.  Respiratory effort nonlabored Abd: soft, ND, exquisitely tender in RLQ at McBurney's point, rebound and guarding present, +BS, no masses, hernias, or organomegaly, no scars noted MS: all 4 extremities are symmetrical with no cyanosis, clubbing, or edema. Skin: warm and dry with no masses, lesions, or rashes Psych: A&Ox3 with an appropriate affect.   Results for orders placed or performed during the hospital encounter of 06/11/14 (from the past 48 hour(s))  CBC with Differential     Status: Abnormal   Collection Time: 06/11/14  3:50 AM  Result Value Ref Range   WBC 18.0 (H) 4.0 - 10.5 K/uL   RBC 5.08 4.22 - 5.81 MIL/uL   Hemoglobin 15.1 13.0 - 17.0 g/dL   HCT 41.6 39.0 - 52.0 %   MCV 81.9 78.0 - 100.0 fL   MCH 29.7 26.0 - 34.0 pg   MCHC 36.3 (H) 30.0 - 36.0 g/dL   RDW 12.4 11.5 - 15.5 %   Platelets 334 150 - 400 K/uL   Neutrophils Relative % 80 (H) 43 - 77 %   Neutro Abs 13.5 (H) 1.7 - 7.7 K/uL   Lymphocytes Relative 12 12 - 46 %   Lymphs Abs 2.1 0.7 - 4.0 K/uL   Monocytes Relative 7  3 - 12 %   Monocytes Absolute 1.2 (H) 0.1 - 1.0 K/uL   Eosinophils Relative 1 0 - 5 %   Eosinophils Absolute 0.1 0.0 - 0.7 K/uL   Basophils Relative 0 0 - 1 %   Basophils Absolute 0.1 0.0 - 0.1 K/uL  Comprehensive metabolic panel     Status: Abnormal   Collection Time: 06/11/14  3:50 AM  Result Value Ref Range   Sodium 138 135 - 145 mmol/L    Comment: Please note change in reference range.   Potassium 4.2 3.5 - 5.1 mmol/L    Comment: Please note change in reference range.   Chloride 104 96 - 112 mEq/L   CO2 25 19 - 32 mmol/L   Glucose, Bld 109 (H) 70 - 99 mg/dL   BUN 12 6 - 23 mg/dL   Creatinine, Ser 0.99 0.50 - 1.35 mg/dL   Calcium 10.0  8.4 - 10.5 mg/dL   Total Protein 7.7 6.0 - 8.3 g/dL   Albumin 4.6 3.5 - 5.2 g/dL   AST 22 0 - 37 U/L   ALT 23 0 - 53 U/L   Alkaline Phosphatase 74 39 - 117 U/L   Total Bilirubin 0.6 0.3 - 1.2 mg/dL   GFR calc non Af Amer >90 >90 mL/min   GFR calc Af Amer >90 >90 mL/min    Comment: (NOTE) The eGFR has been calculated using the CKD EPI equation. This calculation has not been validated in all clinical situations. eGFR's persistently <90 mL/min signify possible Chronic Kidney Disease.    Anion gap 9 5 - 15  Lipase, blood     Status: None   Collection Time: 06/11/14  3:50 AM  Result Value Ref Range   Lipase 23 11 - 59 U/L  Urinalysis, Routine w reflex microscopic     Status: Abnormal   Collection Time: 06/11/14  7:00 AM  Result Value Ref Range   Color, Urine AMBER (A) YELLOW    Comment: BIOCHEMICALS MAY BE AFFECTED BY COLOR   APPearance CLEAR CLEAR   Specific Gravity, Urine 1.033 (H) 1.005 - 1.030   pH 6.0 5.0 - 8.0   Glucose, UA NEGATIVE NEGATIVE mg/dL   Hgb urine dipstick NEGATIVE NEGATIVE   Bilirubin Urine NEGATIVE NEGATIVE   Ketones, ur 15 (A) NEGATIVE mg/dL   Protein, ur NEGATIVE NEGATIVE mg/dL   Urobilinogen, UA 0.2 0.0 - 1.0 mg/dL   Nitrite NEGATIVE NEGATIVE   Leukocytes, UA NEGATIVE NEGATIVE    Comment: MICROSCOPIC NOT DONE ON URINES WITH NEGATIVE PROTEIN, BLOOD, LEUKOCYTES, NITRITE, OR GLUCOSE <1000 mg/dL.   Ct Abdomen Pelvis W Contrast  06/11/2014   CLINICAL DATA:  Initial encounter for severe right lower quadrant pain with nausea and vomiting for 1 day duration.  EXAM: CT ABDOMEN AND PELVIS WITH CONTRAST  TECHNIQUE: Multidetector CT imaging of the abdomen and pelvis was performed using the standard protocol following bolus administration of intravenous contrast.  CONTRAST:  163m OMNIPAQUE IOHEXOL 300 MG/ML  SOLN  COMPARISON:  None.  FINDINGS: Lower chest:  Unremarkable.  Hepatobiliary: No focal abnormality within the liver parenchyma. There is no evidence for gallstones,  gallbladder wall thickening, or pericholecystic fluid. No intrahepatic or extrahepatic biliary dilation.  Pancreas: No focal mass lesion. No dilatation of the main duct. No intraparenchymal cyst. No peripancreatic edema.  Spleen: No splenomegaly. No focal mass lesion.  Adrenals/Urinary Tract: No adrenal nodule or mass. Kidneys are unremarkable. Specifically, no evidence for renal stones. No hydroureter or ureteral stones. Urinary bladder  is decompressed with normal imaging features.  Stomach/Bowel: Stomach is nondistended. No gastric wall thickening. No evidence of outlet obstruction. Duodenum is normally positioned as is the ligament of Treitz. No small bowel wall thickening. No small bowel dilatation. Terminal ileum is normal. The appendix is dilated at 9 mm diameter with a diffusely fluid filled lumen and subtle periappendiceal edema/ inflammation. No extraluminal gas or adjacent mesenteric abscess. No gross colonic mass. No colonic wall thickening. No substantial diverticular change.  Multiple perirectal lymph nodes are identified, measuring up to 11 mm in short axis. There is some subtle wall thickening in the distal rectum, eccentric to the right.  Vascular/Lymphatic: No abdominal aortic aneurysm. No gastrohepatic or hepatoduodenal ligament lymphadenopathy. No retroperitoneal lymphadenopathy. There is no pelvic sidewall lymphadenopathy. Please see stomach/ bowel section above.  Reproductive: Prostate gland and seminal vesicles are unremarkable.  Other: No substantial intraperitoneal free fluid.  Musculoskeletal: Bone windows reveal no worrisome lytic or sclerotic osseous lesions.  IMPRESSION: Dilated inflamed appendix consistent with acute appendicitis. No evidence for perforation or abscess at this time.  Abnormal perirectal lymph nodes with subtle asymmetric wall thickening in the distal rectum. Although neoplasm would be unusual in a patient of this age, close followup is recommended and GI consultation may  be indicated.  These results were called by telephone at the time of interpretation on 06/11/2014 at 7:35 am to Dr. Drenda Freeze, who verbally acknowledged these results.   Electronically Signed   By: Misty Stanley M.D.   On: 06/11/2014 07:39      Assessment/Plan Acute appendicitis Perirectal LAD Rectal wall thickening - has chronic mucous on his stools PTSD - on prazosin qHS  Plan: 1.  Admit to [facility], urgent lap appy 2.  NPO, bowel rest, IVF, pain control, antiemetics, antibiotics (cipro/flagyl) 3.  SCD's  4.  Ambulate and IS 5.  May need referral to GI for perirectal LAD and rectal wall thickening.  He has seen someone in the past for mucous on stools.  Never had a colonoscopy.     Coralie Keens, [facility] 06/11/2014, 10:10 AM Pager: [phone]

## 2014-06-11 NOTE — Transfer of Care (Signed)
Immediate Anesthesia Transfer of Care Note  Patient: Jeffrey Hoffman  Procedure(s) Performed: Procedure(s): APPENDECTOMY LAPAROSCOPIC (N/A)  Patient Location: PACU  Anesthesia Type:General  Level of Consciousness: awake, alert , oriented and patient cooperative  Airway & Oxygen Therapy: Patient Spontanous Breathing and Patient connected to nasal cannula oxygen  Post-op Assessment: Report given to PACU RN and Post -op Vital signs reviewed and stable  Post vital signs: Reviewed and stable  Complications: No apparent anesthesia complications

## 2014-06-11 NOTE — Anesthesia Postprocedure Evaluation (Signed)
  Anesthesia Post-op Note  Patient: Jeffrey Hoffman  Procedure(s) Performed: Procedure(s): APPENDECTOMY LAPAROSCOPIC (N/A)  Patient Location: PACU  Anesthesia Type:General  Level of Consciousness: awake, alert  and oriented  Airway and Oxygen Therapy: Patient Spontanous Breathing and Patient connected to nasal cannula oxygen  Post-op Pain: mild  Post-op Assessment: Post-op Vital signs reviewed, Patient's Cardiovascular Status Stable, Respiratory Function Stable, Patent Airway and No signs of Nausea or vomiting  Post-op Vital Signs: stable  Last Vitals:  Filed Vitals:   06/11/14 1345  BP: 136/70  Pulse: 57  Temp:   Resp: 15    Complications: No apparent anesthesia complications

## 2014-06-11 NOTE — ED Notes (Signed)
The patient has been having abdominal pain, nausea and vomiting.  He says he left work due to having abdominal pain.  He says he has thrown up five times in the last 24hrs.  He says he has had a BM but it was hard to "use the bathroom".  The patient rates his pain 5/10.  He did say it is cramping and it is 10/10 when it cramps.

## 2014-06-11 NOTE — ED Provider Notes (Signed)
Patient states he was at work last night and he started getting periumbilical abdominal pain followed by multiple episodes of prolonged vomiting. He reports as the night progressed his pain is now in his right lower quadrant. He states deep breathing or movement makes the pain worse.  On exam patient has flushed cheeks. His lips are dry. On abdominal exam he is soft without Rovsing's sign. He is tender in the right lower quadrant.  Medical screening examination/treatment/procedure(s) were conducted as a shared visit with non-physician practitioner(s) and myself.  I personally evaluated the patient during the encounter.   EKG Interpretation None       Devoria AlbeIva Burnetta Kohls, MD, Armando GangFACEP   Ward GivensIva L Isabellah Sobocinski, MD 06/11/14 [phone]

## 2014-06-12 MED ORDER — HYDROCODONE-ACETAMINOPHEN 5-325 MG PO TABS: 1.0000 | ORAL_TABLET | Freq: Four times a day (QID) | ORAL | Status: AC | PRN

## 2014-06-12 NOTE — Progress Notes (Signed)
AVS discharge instructions were reviewed with patient. Patient was also given a prescription for hydrocodone to take to his pharmacy. Patient stated that he did not have any questions. Staff will assist to his transportation.

## 2014-06-12 NOTE — Progress Notes (Signed)
Utilization Review completed.  

## 2014-06-12 NOTE — Discharge Summary (Addendum)
Patient ID: Jeffrey Hoffman [id number] 24 y.o. [birth year]  Admit date: 06/11/2014  Discharge date and time: 06/12/2013  Admitting Physician: Jeffrey Loron, MD  Discharge Physician: Jeffrey Hoffman  Admission Diagnoses: RLQ abdominal pain [R10.31] Acute appendicitis, unspecified acute appendicitis type [K35.80]  Discharge Diagnoses: Acute separative appendicitis   rectal thickening identified on CT scan. Chronic mucous in stools. 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 with acute onset of abdominal pain since last night while was at work. Pain has progressively worsened over the last 12 hours and peaked at a 10/10 sharp pain. The pain originally started periumbilical and has settled in the RLQ. Pain accompanied by N/V, and fever/chills. Denies precipitating or alleviating factors, no radiating pain. Notes back pain and some SOB due to pain with deep breaths. Never had this before. Does note he saw a GI doctor for excessive mucous found on stools with BM's, he says they didn't find any thing wrong. Denies rectal bleeding, says he was constipated last night but usually has 3-4 BM's a day. His CT shows acute appendicitis and leukocytosis. He also has abnormal perirectal LAD and rectal wall thickening. No h/o abdominal surgery.  Hospital Course: On the day of admission the patient was taken to the operating room by Dr. Dwain Hoffman and underwent a laparoscopic appendectomy. Supper to appendicitis was found. No perforation or abscess. The patient did well overnight and resume diet and ambulation and voiding and wanted to go home. I discussed the CT scan findings of rectal thickening. I performed a rectal exam which showed normal sphincter tone,  no mass, no blood, and no significant tenderness. The patient is already under evaluation for GI symptoms by Dr. Christiana Hoffman and an ultrasound and upper GI have been ordered but not done. I stressed to the patient that he should go ahead and have this test done, but that he needed to follow-up and see Dr. Madilyn Hoffman in the office and possibly will need a colonoscopy to rule out colitis or proctitis.    The patient was discharged on a Saturday, 06/12/2013. His abdomen was soft and nontender. The wounds looked good.     Diet and activities were discussed. Follow-up with Dr. Dwain Hoffman was recommended. Follow-up with Dr. Dorena Hoffman was recommended. Prescription for Norco was given to the patient.  Consults: None  Significant Diagnostic Studies: Surgical pathology, pending. CT scan. Lab work.  Treatments: surgery: Laparoscopic appendectomy  Disposition: Home  Patient Instructions:    Medication List    STOP taking these medications        HYDROcodone-acetaminophen 7.5-325 mg/15 ml solution  Commonly known as:  HYCET  Replaced by:  HYDROcodone-acetaminophen 5-325 MG per tablet      TAKE these medications        HYDROcodone-acetaminophen 5-325 MG per tablet  Commonly known as:  NORCO  Take 1-2 tablets by mouth every 6 (six) hours as needed for moderate pain or severe pain.     prazosin 5 MG capsule  Commonly known as:  MINIPRESS  Take 5 mg by mouth at bedtime as needed (for sleep).        Activity: No sports or heavy lifting for 3 weeks. Diet: low fat, low cholesterol diet Wound Care: none needed  Follow-up:  With Dr. Dwain Hoffman in 3 weeks.  Signed: Mikey Hoffman  Jeffrey SessionsM. Jeffrey Hoffman, M.D., FACS General and minimally invasive surgery Breast and Colorectal Surgery  06/12/2014, 10:13 AM

## 2014-06-14 ENCOUNTER — Encounter (HOSPITAL_COMMUNITY): Payer: Self-pay | Admitting: General Surgery

## 2015-06-02 IMAGING — CT CT ABD-PELV W/ CM
2 of 7 series · 15 of 46 positions shown, 17 images · IV contrast (Omni 300)
Comparison: None.

CLINICAL DATA: Initial encounter for severe right lower quadrant
pain with nausea and vomiting for 1 day duration.

EXAM:
CT ABDOMEN AND PELVIS WITH CONTRAST
TECHNIQUE: Multidetector CT imaging of the abdomen and pelvis was performed
using the standard protocol following bolus administration of
intravenous contrast.
CONTRAST:  100mL OMNIPAQUE IOHEXOL 300 MG/ML  SOLN

[Series 2: abd/ pelvis 5.0 i30f 1 · axial · 0.79mm/px · z∈[-553,-63]mm · 12 of 110 slices shown, 14 images]
[im 6/110  soft-tissue]
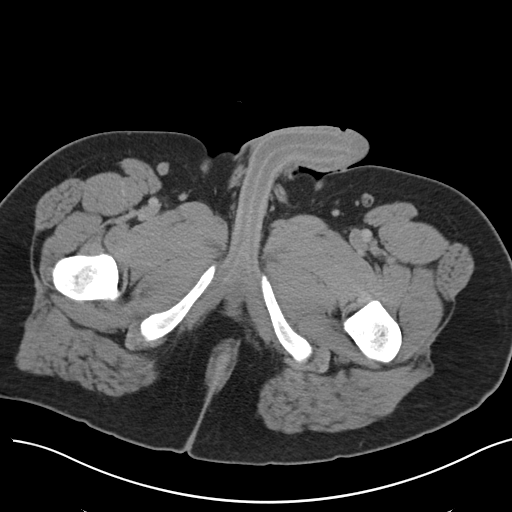
[im 6/110  bone]
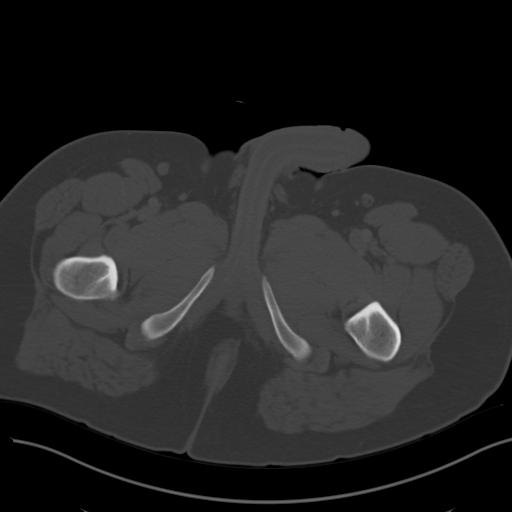
[im 16/110  soft-tissue]
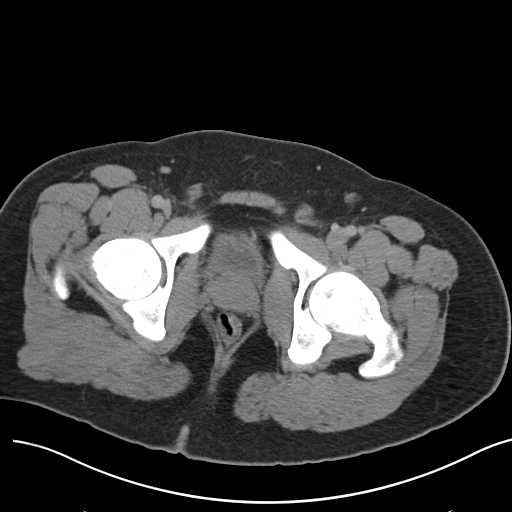
[im 26/110  soft-tissue]
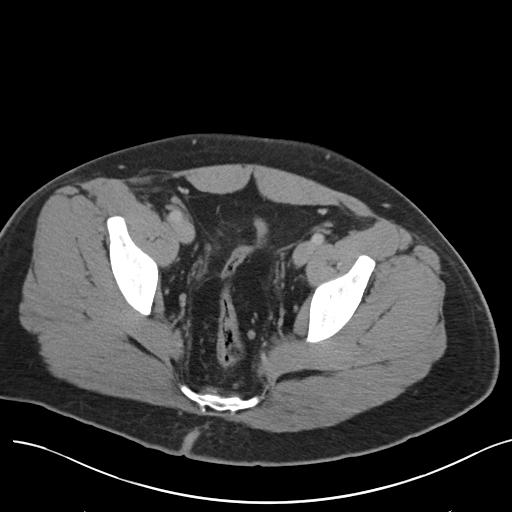
[im 32/110  soft-tissue]
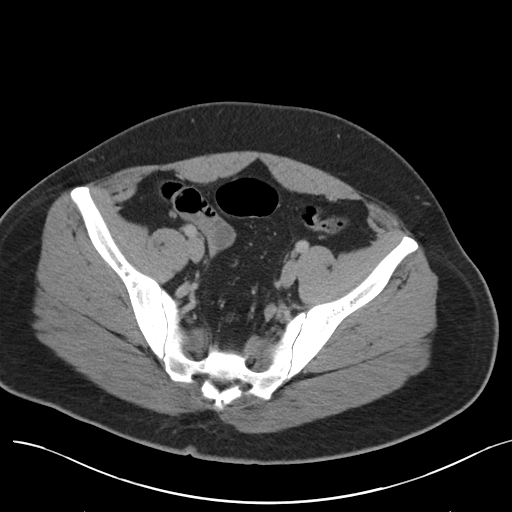
[im 42/110  soft-tissue]
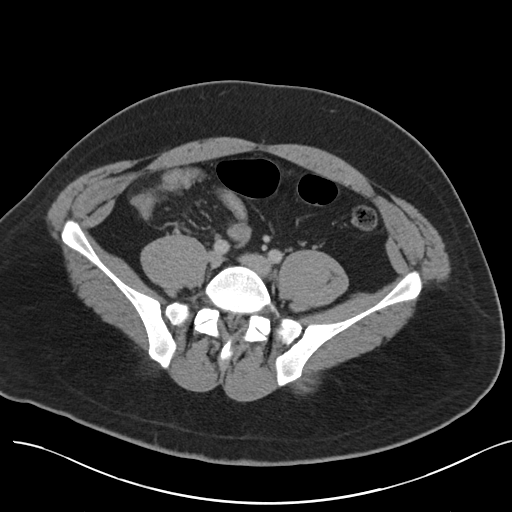
[im 52/110  soft-tissue]
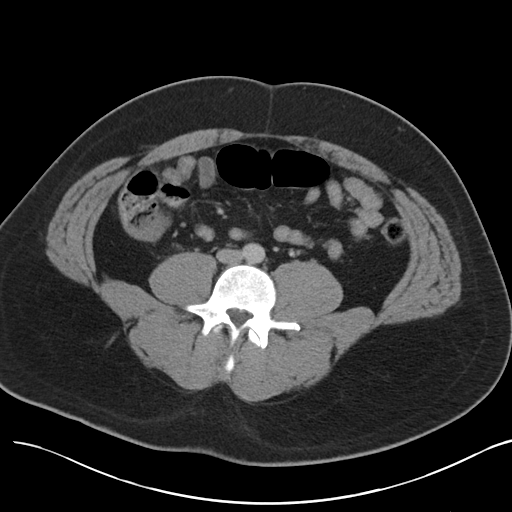
[im 58/110  soft-tissue]
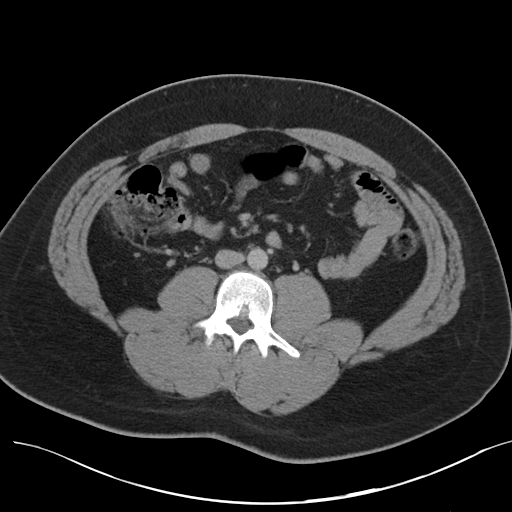
[im 68/110  soft-tissue]
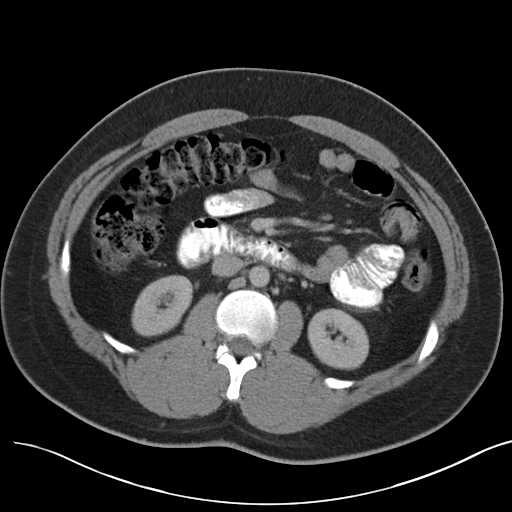
[im 78/110  soft-tissue]
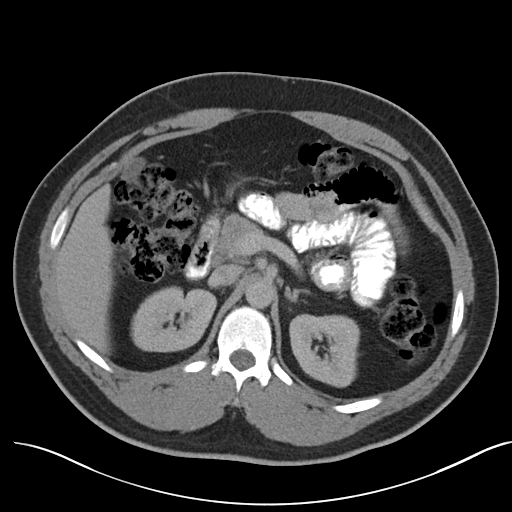
[im 78/110  bone]
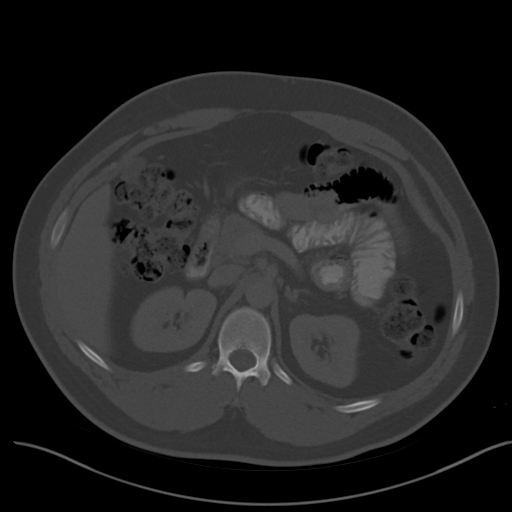
[im 84/110  soft-tissue]
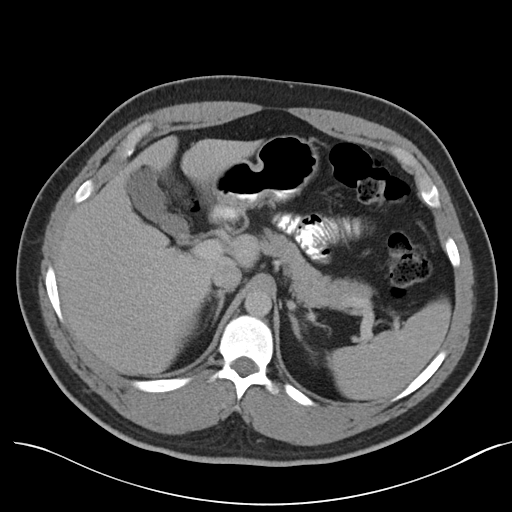
[im 94/110  soft-tissue]
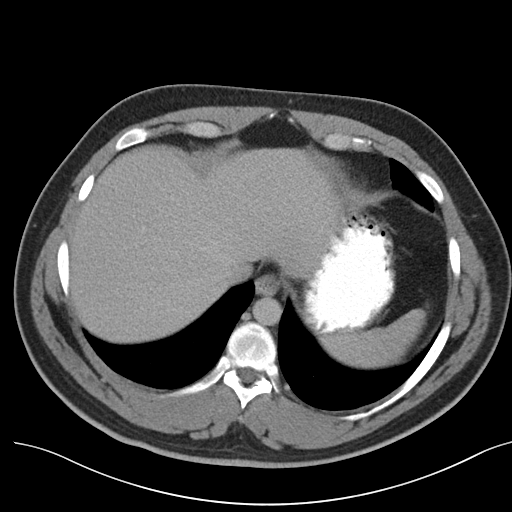
[im 104/110  soft-tissue]
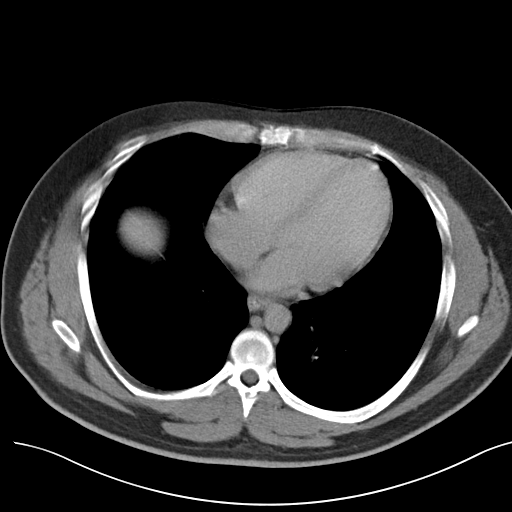

[Series 5: coronals · coronal · 0.88mm/px · 3 of 145 slices shown]
[im 37/145  soft-tissue]
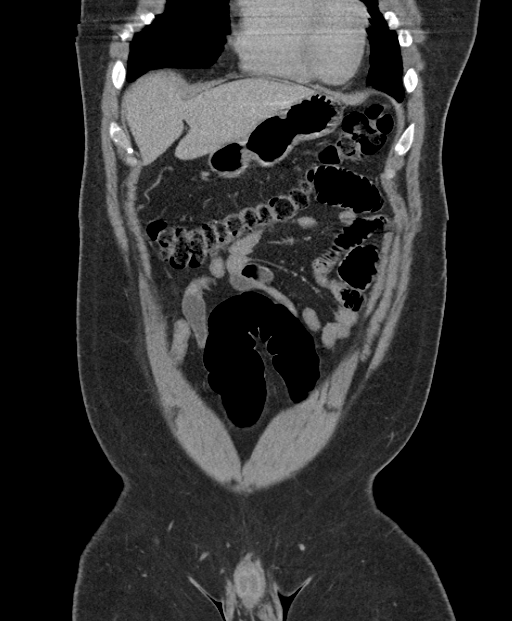
[im 73/145  soft-tissue]
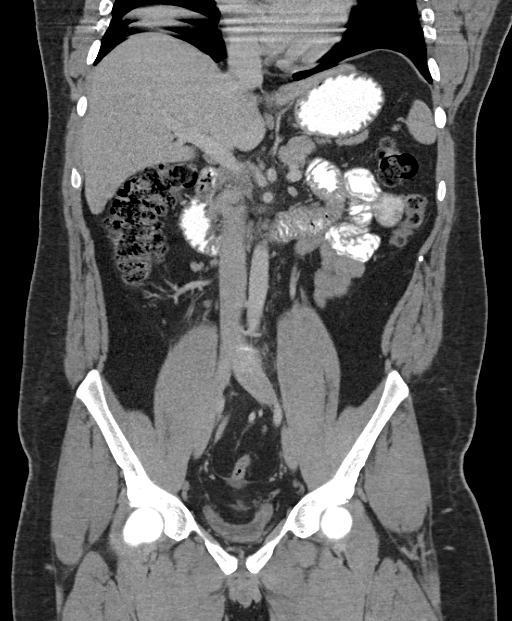
[im 109/145  soft-tissue]
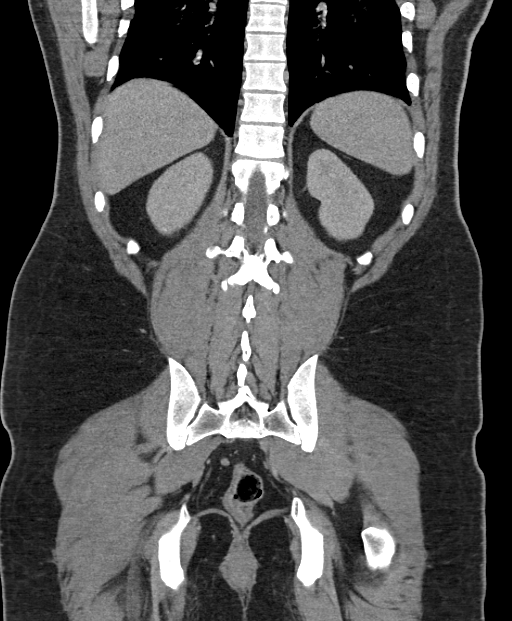

[15 of 46 positions shown; findings below may reference images not displayed]

FINDINGS: Lower chest:  Unremarkable.

Hepatobiliary: No focal abnormality within the liver parenchyma.
There is no evidence for gallstones, gallbladder wall thickening, or
pericholecystic fluid. No intrahepatic or extrahepatic biliary
dilation.

Pancreas: No focal mass lesion. No dilatation of the main duct. No
intraparenchymal cyst. No peripancreatic edema.

Spleen: No splenomegaly. No focal mass lesion.

Adrenals/Urinary Tract: No adrenal nodule or mass. Kidneys are
unremarkable. Specifically, no evidence for renal stones. No
hydroureter or ureteral stones. Urinary bladder is decompressed with
normal imaging features.

Stomach/Bowel: Stomach is nondistended. No gastric wall thickening.
No evidence of outlet obstruction. Duodenum is normally positioned
as is the ligament of Treitz. No small bowel wall thickening. No
small bowel dilatation. Terminal ileum is normal. The appendix is
dilated at 9 mm diameter with a diffusely fluid filled lumen and
subtle periappendiceal edema/ inflammation. No extraluminal gas or
adjacent mesenteric abscess. No gross colonic mass. No colonic wall
thickening. No substantial diverticular change.

Multiple perirectal lymph nodes are identified, measuring up to 11
mm in short axis. There is some subtle wall thickening in the distal
rectum, eccentric to the right.

Vascular/Lymphatic: No abdominal aortic aneurysm. No gastrohepatic
or hepatoduodenal ligament lymphadenopathy. No retroperitoneal
lymphadenopathy. There is no pelvic sidewall lymphadenopathy. Please
see stomach/ bowel section above.

Reproductive: Prostate gland and seminal vesicles are unremarkable.

Other: No substantial intraperitoneal free fluid.

Musculoskeletal: Bone windows reveal no worrisome lytic or sclerotic
osseous lesions.
IMPRESSION: Dilated inflamed appendix consistent with acute appendicitis. No
evidence for perforation or abscess at this time.

Abnormal perirectal lymph nodes with subtle asymmetric wall
thickening in the distal rectum. Although neoplasm would be unusual
in a patient of this age, close followup is recommended and GI
consultation may be indicated.

These results were called by telephone at the time of interpretation
on 06/11/2014 at [DATE] to Dr. Farheen Cirilo, who verbally
acknowledged these results.
